# Patient Record
Sex: Female | Born: 1985 | Race: White | Marital: Single | Smoking: Never smoker
Health system: Northeastern US, Academic
[De-identification: ages and names within clinical notes are randomized; demographics above are authoritative.]

---

## 2006-12-10 DIAGNOSIS — I491 Atrial premature depolarization: Secondary | ICD-10-CM | POA: Insufficient documentation

## 2006-12-10 DIAGNOSIS — F3289 Other specified depressive episodes: Secondary | ICD-10-CM | POA: Insufficient documentation

## 2010-01-31 ENCOUNTER — Ambulatory Visit
Admit: 2010-01-31 | Discharge: 2010-01-31 | Disposition: A | Discharge status: Home / Self Care | Payer: Self-pay | Source: Ambulatory Visit | Attending: Obstetrics | Admitting: Obstetrics

## 2010-01-31 ENCOUNTER — Ambulatory Visit
Admit: 2010-01-31 | Discharge: 2010-01-31 | Disposition: A | Discharge status: Home / Self Care | Payer: Self-pay | Source: Ambulatory Visit | Attending: Primary Care | Admitting: Primary Care

## 2010-01-31 LAB — URINALYSIS REFLEX TO CULTURE
Blood,UA: NEGATIVE
Ketones, UA: NEGATIVE
Leuk Esterase,UA: NEGATIVE
Nitrite,UA: NEGATIVE
Protein,UA: NEGATIVE mg/dL

## 2010-01-31 LAB — CBC AND DIFFERENTIAL
Baso # K/uL: 0 THOU/uL (ref 0.0–0.1)
Basophil %: 0.4 % (ref 0.1–1.2)
Eos # K/uL: 0.1 THOU/uL (ref 0.0–0.4)
Eosinophil %: 2.8 % (ref 0.7–5.8)
Hematocrit: 35 % (ref 34–45)
Hemoglobin: 11.6 g/dL (ref 11.2–15.7)
Lymph # K/uL: 1.6 THOU/uL (ref 1.2–3.7)
Lymphocyte %: 33.7 % (ref 19.3–51.7)
MCV: 88 fL (ref 79–95)
Mono # K/uL: 0.4 THOU/uL (ref 0.2–0.9)
Monocyte %: 9.1 % (ref 4.7–12.5)
Neut # K/uL: 2.5 THOU/uL (ref 1.6–6.1)
Platelets: 221 THOU/uL (ref 160–370)
RBC: 4 MIL/uL (ref 3.9–5.2)
RDW: 14.3 % (ref 11.7–14.4)
Seg Neut %: 54 % (ref 34.0–71.1)
WBC: 4.6 THOU/uL (ref 4.0–10.0)

## 2010-01-31 LAB — COMPREHENSIVE METABOLIC PANEL
ALT: 29 U/L (ref 0–35)
AST: 33 U/L (ref 0–35)
Albumin: 4.3 g/dL (ref 3.5–5.2)
Alk Phos: 59 U/L (ref 35–105)
Anion Gap: 10 (ref 7–16)
Bilirubin,Total: 0.4 mg/dL (ref 0.0–1.2)
CO2: 25 mmol/L (ref 20–28)
Calcium: 9.3 mg/dL (ref 9.0–10.4)
Chloride: 104 mmol/L (ref 96–108)
Creatinine: 0.73 mg/dL (ref 0.51–0.95)
GFR,Black: >59 *
GFR,Caucasian: >59 *
Glucose: 80 mg/dL (ref 74–106)
Lab: 13 mg/dL (ref 6–20)
Potassium: 3.8 mmol/L (ref 3.3–5.1)
Sodium: 139 mmol/L (ref 133–145)
Total Protein: 6.9 g/dL (ref 6.3–7.7)

## 2010-01-31 LAB — URINALYSIS WITH REFLEX TO CULTURE
Specific Gravity,UA: 1.013 (ref 1.002–1.030)
pH,UA: 5.5 (ref 5.0–8.0)

## 2010-01-31 LAB — AMYLASE: Amylase: 70 U/L (ref 28–100)

## 2010-01-31 LAB — MAGNESIUM: Magnesium: 1.7 meq/L (ref 1.3–2.1)

## 2010-01-31 LAB — TYPE AND SCREEN
ABO RH Blood Type: O POS
Antibody Screen: NEGATIVE

## 2010-01-31 LAB — HEMOGLOBIN A1C: Hemoglobin A1C: 4.9 % (ref 4.0–6.0)

## 2010-01-31 LAB — LIPASE: Lipase: 40 U/L (ref 13–60)

## 2010-01-31 LAB — PREGNANCY TEST, SERUM: Preg,Serum: POSITIVE — AB

## 2010-01-31 LAB — TSH: TSH: 8.95 u[IU]/mL — ABNORMAL HIGH (ref 0.27–4.20)

## 2010-02-01 LAB — T4, FREE: Free T4: 1.1 ng/dL (ref 0.9–1.7)

## 2010-02-04 LAB — VITAMIN D
25-OH VIT D2: <4 ng/mL
25-OH VIT D3: 19 ng/mL
25-OH Vit Total: 19 ng/mL — ABNORMAL LOW (ref 30–80)

## 2010-02-13 ENCOUNTER — Ambulatory Visit
Admit: 2010-02-13 | Discharge: 2010-02-13 | Disposition: A | Discharge status: Home / Self Care | Payer: Self-pay | Source: Ambulatory Visit | Attending: Obstetrics | Admitting: Obstetrics

## 2010-02-13 ENCOUNTER — Encounter: Payer: Self-pay | Admitting: Gastroenterology

## 2010-02-14 LAB — CHLAMYDIA PLASMID DNA AMPLIFICATION

## 2010-02-15 LAB — SURGICAL PATHOLOGY

## 2010-02-17 LAB — GC CULTURE

## 2010-02-28 ENCOUNTER — Ambulatory Visit: Payer: Self-pay | Admitting: Primary Care

## 2010-02-28 NOTE — Progress Notes (Signed)
Reason For Visit   Follow up: Lab results.  SUBJECTIVE: Gail Griffin is here today for followup of thyroid condition.    Since January 2000 and her free T4 has been between 1.1 and 1.0.  Her TSH   has been elevated three out of four determinations.  Her latest free T4 was   1.1, with a TSH of 8.96.  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  She occasionally becomes out of breath.  She runs   for exercise to stay trim and apparently there is dyspnea out of proportion   to her running.  Her vitamin D 3 is down to 19.     OBJECTIVE: BP is 90/60 on the right.  Chest is clear to PandA.  Heart is of   normal size, however, the sound.  Abdomen is scaphoid, without any organs,   masses, or tenderness.     ASSESSMENT: Compensated hypothyroidism.     PLAN: She is going to start Synthroid 100 mcg daily, and repeat her   chemistries along with the peroxidase, thyroid antibodies, thyroglobulin,   and antithyroglobulin.  She will return here in 7 weeks just after   completing her chemistries.  .  Current Meds   Retin-A CREA;apply twice daily; RPT  Minocycline HCl 100 MG Tablet;TAKE 1 TABLET TWICE DAILY.; Rx  Ocella TABS;TAKE 1 TABLET DAILY.; RPT  Fluticasone Propionate 50 MCG/ACT Suspension;USE 1 SPRAY IN EACH NOSTRIL   TWICE DAILY.; Rx  Citalopram Hydrobromide 20 MG Tablet;take one tablet by mouth every day; Rx.  Vital Signs   Recorded by Aava Deland,Andrea on 28 Feb 2010 09:20 AM  BP:90/50,  RUE,  Sitting,   Weight: 123 lb.  Signature   Electronically signed by: Joana Reamer  M.D.; 02/28/2010 9:52 AM EST.

## 2010-04-19 ENCOUNTER — Ambulatory Visit: Payer: Self-pay | Admitting: Primary Care

## 2010-06-21 ENCOUNTER — Ambulatory Visit: Payer: Self-pay | Admitting: Primary Care

## 2010-12-17 NOTE — Miscellaneous (Unsigned)
 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 ; RPT   Levothyroxine Sodium 100 MCG Tablet; TAKE 1 TABLET DAILY. ; Rx

## 2010-12-27 ENCOUNTER — Encounter: Payer: Self-pay | Admitting: Primary Care

## 2010-12-27 ENCOUNTER — Ambulatory Visit: Payer: Self-pay | Admitting: Primary Care

## 2010-12-27 NOTE — Progress Notes (Signed)
Reason For Visit   Pt here for medication renewal.  SUBJECTIVE: Gail Griffin returns after a long hiatus.     HYPOTHYROIDISM: She still is on Synthroid 0.1 mg daily without any   problems.  She denies change in hair, attention span, sleep pattern or   bowel habits.  No recent free T4 TSH is available.     ACHILLES TENDINITIS: Over the last few months she wakes with pain in the   insertion of the Achilles which is quickly remedied by putting on shoes   that are heeled at least 2 inches.  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     DEPRESSION: Currently she is on citalopram 20 mg daily.  she's tried to stop its a couple of times and found she was unable to.  Her   current boyfriend believes she doesn't need it.     OBJECTIVE: BP is 110/70 on the right seated.  The chest is clear to PandA.    The heart is normal size, rhythm and sounds.  The abdomen is scaphoid   without any orbits, masses or tenderness.  The thyroid is not palpable.    There is no tremor to the outstretched hand and her reflexes are present   and equal bilaterally     ASSESSMENT: She is going to see today on the current thyroid and citalopram   dose that she is doing quite well.     PLAN: She is going to call her gynecologist who has her on Junel oral   contraception but has not continued to fill the prescription.  The patient   will deal with that via the phone.  She'll return here in 6 months.  She is   given a slip to get all the blood work done and a standing order for free   T4 and TSH.  Current Meds   Citalopram Hydrobromide 20 MG Tablet;TAKE ONE TABLET BY MOUTH EVERY   DAY.Appt 12/27/10; Rx  Junel 1.5/30 1.5-30 MG-MCG Tablet;TAKE 1 TABLET DAILY AS DIRECTED.; RPT  Synthroid 100 MCG Tablet;TAKE 1 TABLET DAILY.; RPT.  Vital Signs   Recorded by Stevie Kern on 27 Dec 2010 09:18 AM  BP:106/60,   Weight: 128 lb.  Signature   Electronically signed by: Joana Reamer  M.D.; 12/27/2010 10:21 AM EST.

## 2011-01-07 ENCOUNTER — Ambulatory Visit
Admit: 2011-01-07 | Discharge: 2011-01-07 | Disposition: A | Discharge status: Home / Self Care | Payer: Self-pay | Source: Ambulatory Visit | Attending: Primary Care | Admitting: Primary Care

## 2011-01-07 LAB — CBC AND DIFFERENTIAL
Baso # K/uL: 0 10*3/uL (ref 0.0–0.1)
Basophil %: 0.5 % (ref 0.1–1.2)
Eos # K/uL: 0.2 10*3/uL (ref 0.0–0.4)
Eosinophil %: 3.8 % (ref 0.7–5.8)
Hematocrit: 40 % (ref 34–45)
Hemoglobin: 13 g/dL (ref 11.2–15.7)
Lymph # K/uL: 2 10*3/uL (ref 1.2–3.7)
Lymphocyte %: 47.5 % (ref 19.3–51.7)
MCV: 92 fL (ref 79–95)
Mono # K/uL: 0.4 10*3/uL (ref 0.2–0.9)
Monocyte %: 9.3 % (ref 4.7–12.5)
Neut # K/uL: 1.6 10*3/uL (ref 1.6–6.1)
Platelets: 218 10*3/uL (ref 160–370)
RBC: 4.3 MIL/uL (ref 3.9–5.2)
RDW: 13 % (ref 11.7–14.4)
Seg Neut %: 38.9 % (ref 34.0–71.1)
WBC: 4.2 10*3/uL (ref 4.0–10.0)

## 2011-01-07 LAB — COMPREHENSIVE METABOLIC PANEL
ALT: 20 U/L (ref 0–35)
AST: 26 U/L (ref 0–35)
Albumin: 4.2 g/dL (ref 3.5–5.2)
Alk Phos: 48 U/L (ref 35–105)
Anion Gap: 11 (ref 7–16)
Bilirubin,Total: 0.4 mg/dL (ref 0.0–1.2)
CO2: 24 mmol/L (ref 20–28)
Calcium: 9.2 mg/dL (ref 8.8–10.2)
Chloride: 107 mmol/L (ref 96–108)
Creatinine: 0.91 mg/dL (ref 0.51–0.95)
GFR,Black: >59 *
GFR,Caucasian: >59 *
Glucose: 86 mg/dL (ref 74–106)
Lab: 14 mg/dL (ref 6–20)
Potassium: 4 mmol/L (ref 3.3–5.1)
Sodium: 142 mmol/L (ref 133–145)
Total Protein: 6.9 g/dL (ref 6.3–7.7)

## 2011-01-07 LAB — TSH: TSH: 2.07 u[IU]/mL (ref 0.27–4.20)

## 2011-01-07 LAB — LIPID PANEL
Chol/HDL Ratio: 2.7
Cholesterol: 156 mg/dL
HDL: 57 mg/dL
LDL Calculated: 87 mg/dL
Non HDL Cholesterol: 99 mg/dL
Triglycerides: 60 mg/dL

## 2011-01-07 LAB — URINE MICROSCOPIC (IQ200)
RBC,UA: NONE SEEN /hpf (ref 0–2)
WBC,UA: 2 /hpf (ref 0–5)

## 2011-01-07 LAB — URINALYSIS REFLEX TO CULTURE
Blood,UA: NEGATIVE
Ketones, UA: NEGATIVE
Leuk Esterase,UA: NEGATIVE
Nitrite,UA: NEGATIVE
Protein,UA: 10 mg/dL — AB
Specific Gravity,UA: 1.026 (ref 1.002–1.030)
pH,UA: 5.5 (ref 5.0–8.0)

## 2011-01-07 LAB — T4, FREE: Free T4: 1.6 ng/dL (ref 0.9–1.7)

## 2011-01-07 LAB — MAGNESIUM: Magnesium: 1.7 mEq/L (ref 1.3–2.1)

## 2011-01-08 LAB — HEMOGLOBIN A1C: Hemoglobin A1C: 4.7 % (ref 4.0–6.0)

## 2011-01-10 LAB — VITAMIN D
25-OH VIT D2: <4 ng/mL
25-OH VIT D3: 42 ng/mL
25-OH Vit Total: 42 ng/mL (ref 30–80)

## 2011-05-25 ENCOUNTER — Telehealth: Payer: Self-pay | Admitting: Primary Care

## 2011-05-25 NOTE — Telephone Encounter (Signed)
The patient is calling with rectal bleeding.  She went running this morning and thereafter had some pain and a few spots of blood on the toilet paper after a BM.  This has happened to her twice before in the last 12 years, always after running, always mild, always resolves by the next day.  She has hemorrhoids.  Had a little diarrhea and abdominal cramping this weekend, which happens to her on and off.  Wondering if she should be worried.  I explained that this is common after running, but if it becomes a recurrent problem, she should see Dr. Verdie Drown.

## 2011-06-09 ENCOUNTER — Other Ambulatory Visit: Payer: Self-pay | Admitting: Primary Care

## 2011-06-09 NOTE — Telephone Encounter (Signed)
Gail Griffin has an appt in the next . She needs a refill on thyroid medication. Directions are 1 daily and she would like a 90 day supply. Please send to [facility] in [place]. C/b # if needed is [phone]

## 2011-06-10 ENCOUNTER — Ambulatory Visit
Admit: 2011-06-10 | Discharge: 2011-06-10 | Disposition: A | Discharge status: Home / Self Care | Payer: Self-pay | Source: Ambulatory Visit | Attending: Obstetrics and Gynecology | Admitting: Obstetrics and Gynecology

## 2011-06-10 MED ORDER — LEVOTHYROXINE SODIUM 100 MCG PO TABS *I*
ORAL_TABLET | ORAL | Status: DC
Start: 2011-06-09 — End: 2011-12-04

## 2011-06-13 ENCOUNTER — Other Ambulatory Visit: Payer: Self-pay | Admitting: Primary Care

## 2011-06-13 DIAGNOSIS — E039 Hypothyroidism, unspecified: Secondary | ICD-10-CM

## 2011-06-14 ENCOUNTER — Other Ambulatory Visit: Payer: Self-pay | Admitting: Primary Care

## 2011-06-16 LAB — HPV DNA PROBE WITH CYTOLOGY: HPV Hybrid Capture: NEGATIVE

## 2011-06-17 LAB — GYN CYTOLOGY

## 2011-06-30 ENCOUNTER — Other Ambulatory Visit: Payer: Self-pay | Admitting: Primary Care

## 2011-06-30 NOTE — Telephone Encounter (Signed)
Labs and appointments up to date.  Ok for refill.

## 2011-07-01 ENCOUNTER — Ambulatory Visit: Payer: Self-pay | Admitting: Primary Care

## 2011-08-04 ENCOUNTER — Ambulatory Visit
Admit: 2011-08-04 | Discharge: 2011-08-04 | Disposition: A | Discharge status: Home / Self Care | Payer: Self-pay | Source: Ambulatory Visit | Attending: Primary Care | Admitting: Primary Care

## 2011-08-04 DIAGNOSIS — E039 Hypothyroidism, unspecified: Secondary | ICD-10-CM

## 2011-08-04 LAB — T4, FREE: Free T4: 1.3 ng/dL (ref 0.9–1.7)

## 2011-08-04 LAB — TSH: TSH: 1.11 u[IU]/mL (ref 0.27–4.20)

## 2011-08-06 ENCOUNTER — Ambulatory Visit: Payer: Self-pay | Admitting: Primary Care

## 2011-08-06 ENCOUNTER — Encounter: Payer: Self-pay | Admitting: Primary Care

## 2011-08-06 VITALS — BP 110/80 | HR 42 | Wt 123.0 lb

## 2011-08-06 DIAGNOSIS — E039 Hypothyroidism, unspecified: Secondary | ICD-10-CM

## 2011-08-06 DIAGNOSIS — E559 Vitamin D deficiency, unspecified: Secondary | ICD-10-CM

## 2011-08-06 DIAGNOSIS — K130 Diseases of lips: Secondary | ICD-10-CM

## 2011-08-06 DIAGNOSIS — L309 Dermatitis, unspecified: Secondary | ICD-10-CM

## 2011-08-06 DIAGNOSIS — M7662 Achilles tendinitis, left leg: Secondary | ICD-10-CM

## 2011-08-06 DIAGNOSIS — J029 Acute pharyngitis, unspecified: Secondary | ICD-10-CM

## 2011-08-06 MED ORDER — AMMONIUM LACTATE 12 % EX LOTN *I*
TOPICAL_LOTION | Freq: Two times a day (BID) | CUTANEOUS | Status: AC | PRN
Start: 2011-08-06 — End: ?

## 2011-08-06 NOTE — Progress Notes (Signed)
SUBJECTIVE: PHARYNGITIS: She reports having mild gradual discomfort for the last 2 weeks without fever, chills and lymphadenopathy no cough, sputum production or rhinitis .    Left posterior and lateral heel pain after running for exercise.  The pain never occurs when she wears heels or during exercise.  It is after she removes her shoes she has pain at the insertion of the Achilles laterally.    She has a small "bump" on the left side of her lip for the past year.  It varies in size but never disappears.  There is never any drainage nor does it interfere with speech.    The right triceps area there is a pruritic rash she has been treating with moisturizes of her incontinence.  The skin is rough and pruritic and compatible with eczema.    HYPOTHYROIDISM: She continues on 100 mcg levothyroxin without any problems.  She denies hair loss, change in attention span, bowel habits or sleep pattern.  Her free T4 and TSH were normal.    OBJECTIVE: BP is 110/80 in the right seated.    GENERAL:  Alert, no distress  HEENT:  NCAT, PERRL, EOMI.  No ocular discharge.  TM's pearly with normal anatomy bilaterally.  Nares patent.  Oropharynx clear with moist mucous membranes.  There is a small mucocele in the left lower lip.  NECK:  Supple without significant adenopathy.  Thyroid is small and smooth.  CARDIAC:  RRR, S1 S2, no murmur  LUNGS:  Clear to auscultation bilaterally  ABDOMEN:  Soft, nontender, nondistended with positive bowel sounds, no masses or organomegaly  SKIN:  no rashes    ASSESSMENT: The rash is probably secondary to eczema.  Her throat culture is negative and there is barely any exudate.  The lump on her lip is a mucocele.    PLAN: She will see Dr. Gregery Na regarding mucocele of the lip.  She is going to get any feelings flexion boot to wear at night to avoid the morning pain.  She'll continue her current dose of thyroid.  AmLactin lotion to her upper arm.  She will return here if her symptoms persist.

## 2011-08-08 LAB — STREP A CULTURE, THROAT

## 2011-08-18 ENCOUNTER — Telehealth: Payer: Self-pay | Admitting: Primary Care

## 2011-08-18 NOTE — Telephone Encounter (Signed)
Gail Griffin said her mother told her there was a voice message saying to call er doctor for a consults. Didn't know what this was about. I don't see any one from here calling her. We are to cell her cell [phone] from now on not her home number. Did you call her

## 2011-08-19 NOTE — Telephone Encounter (Signed)
MAY HAVE BEEN DR LANGSTEIN'S OFFICE

## 2011-09-25 ENCOUNTER — Other Ambulatory Visit: Payer: Self-pay | Admitting: Primary Care

## 2011-12-04 ENCOUNTER — Other Ambulatory Visit: Payer: Self-pay | Admitting: Primary Care

## 2011-12-29 ENCOUNTER — Other Ambulatory Visit: Payer: Self-pay | Admitting: Primary Care

## 2011-12-29 ENCOUNTER — Telehealth: Payer: Self-pay | Admitting: Primary Care

## 2011-12-29 MED ORDER — CITALOPRAM HYDROBROMIDE 20 MG PO TABS *I*
ORAL_TABLET | ORAL | Status: DC
Start: 2011-12-29 — End: 2012-03-12

## 2012-01-26 ENCOUNTER — Ambulatory Visit: Payer: Self-pay | Admitting: Primary Care

## 2012-02-17 ENCOUNTER — Ambulatory Visit: Payer: Self-pay | Admitting: Primary Care

## 2012-02-20 ENCOUNTER — Ambulatory Visit: Payer: Self-pay | Admitting: Primary Care

## 2012-02-20 VITALS — BP 90/60 | HR 40 | Ht 65.0 in | Wt 129.0 lb

## 2012-02-20 DIAGNOSIS — E039 Hypothyroidism, unspecified: Secondary | ICD-10-CM

## 2012-02-20 MED ORDER — TACROLIMUS 0.03 % EX OINT *I*
TOPICAL_OINTMENT | Freq: Two times a day (BID) | CUTANEOUS | Status: AC
Start: 2012-02-20 — End: ?

## 2012-02-20 NOTE — Progress Notes (Signed)
SUBJECTIVE: HYPOTHYROIDISM: Patient returns days planning to move to [place] and get married they are in the fall.  She is feeling very well and was on a stable dose with her.  She denies any change in weight, hair loss, attention span, bowel habits, mostly when.    DEPRESSION: On 20 mg citalopram she feels very well.  She is jovial and engaging and has no problem with diet, exercise or sleep.  She said no reaction to the drug.    OBJECTIVE: BP is 90/60 in the right seated.  Pulse rate is 40.    GENERAL:  Alert, no distress  NECK:  Supple without significant adenopathy.  The thyroid is very small and in normal position.  CARDIAC:  RRR, S1 S2, no murmur  LUNGS:  Clear to auscultation bilaterally  ABDOMEN:  Soft, nontender, nondistended with positive bowel sounds, no masses or organomegaly  SKIN:  no rashes  NEURO: Cranial nerves are intact.  Finger to nose normal Romberg and Babinski signs are normal.  Reflexes are present and equal bilaterally    ASSESSMENT: Well controlled hypothyroidism and depression    PLAN: Continue current medications    Repeat laboratory tests next week    Return here if any other abnormalities before transition to [place]

## 2012-02-26 ENCOUNTER — Ambulatory Visit
Admit: 2012-02-26 | Discharge: 2012-02-26 | Disposition: A | Discharge status: Home / Self Care | Payer: Self-pay | Source: Ambulatory Visit | Attending: Primary Care | Admitting: Primary Care

## 2012-02-26 DIAGNOSIS — E039 Hypothyroidism, unspecified: Secondary | ICD-10-CM

## 2012-02-26 LAB — COMPREHENSIVE METABOLIC PANEL
ALT: 25 U/L (ref 0–35)
AST: 31 U/L (ref 0–35)
Albumin: 4.3 g/dL (ref 3.5–5.2)
Alk Phos: 55 U/L (ref 35–105)
Anion Gap: 9 (ref 7–16)
Bilirubin,Total: 0.3 mg/dL (ref 0.0–1.2)
CO2: 26 mmol/L (ref 20–28)
Calcium: 8.6 mg/dL — ABNORMAL LOW (ref 8.8–10.2)
Chloride: 104 mmol/L (ref 96–108)
Creatinine: 0.77 mg/dL (ref 0.51–0.95)
GFR,Black: 123 *
GFR,Caucasian: 106 *
Glucose: 95 mg/dL (ref 60–99)
Lab: 16 mg/dL (ref 6–20)
Potassium: 4.2 mmol/L (ref 3.3–5.1)
Sodium: 139 mmol/L (ref 133–145)
Total Protein: 7 g/dL (ref 6.3–7.7)

## 2012-02-26 LAB — CBC AND DIFFERENTIAL
Baso # K/uL: 0 10*3/uL (ref 0.0–0.1)
Basophil %: 0.4 % (ref 0.1–1.2)
Eos # K/uL: 0.2 10*3/uL (ref 0.0–0.4)
Eosinophil %: 4.5 % (ref 0.7–5.8)
Hematocrit: 39 % (ref 34–45)
Hemoglobin: 13.3 g/dL (ref 11.2–15.7)
Lymph # K/uL: 1.9 10*3/uL (ref 1.2–3.7)
Lymphocyte %: 40 % (ref 19.3–51.7)
MCV: 93 fL (ref 79–95)
Mono # K/uL: 0.4 10*3/uL (ref 0.2–0.9)
Monocyte %: 8.5 % (ref 4.7–12.5)
Neut # K/uL: 2.2 10*3/uL (ref 1.6–6.1)
Platelets: 234 10*3/uL (ref 160–370)
RBC: 4.2 MIL/uL (ref 3.9–5.2)
RDW: 12.9 % (ref 11.7–14.4)
Seg Neut %: 46.6 % (ref 34.0–71.1)
WBC: 4.7 10*3/uL (ref 4.0–10.0)

## 2012-02-26 LAB — TSH: TSH: 2.05 u[IU]/mL (ref 0.27–4.20)

## 2012-02-26 LAB — T4, FREE: Free T4: 1.5 ng/dL (ref 0.9–1.7)

## 2012-02-27 LAB — MICROALBUMIN, URINE, RANDOM
Creatinine,UR: 120 mg/dL (ref 20–300)
Microalb/Creat Ratio: <2.5 mg MA/g CR (ref 0.0–29.9)
Microalbumin,UR: <0.3 mg/dL

## 2012-03-12 ENCOUNTER — Other Ambulatory Visit: Payer: Self-pay | Admitting: Primary Care

## 2012-03-12 NOTE — Telephone Encounter (Signed)
Labs and appointments up to date.  Ok for refill.

## 2012-03-16 ENCOUNTER — Other Ambulatory Visit: Payer: Self-pay | Admitting: Primary Care

## 2012-03-16 ENCOUNTER — Telehealth: Payer: Self-pay | Admitting: Primary Care

## 2012-03-16 MED ORDER — LEVOTHYROXINE SODIUM 100 MCG PO TABS *I*
ORAL_TABLET | ORAL | Status: DC
Start: 2012-03-16 — End: 2012-06-28

## 2012-03-16 NOTE — Telephone Encounter (Signed)
Please refill levothyroxine , qty 90.  Uses [facility].  She is completely out of this med.  Says she requested this a few days ago, but citalopram was refilled instead.  CB# below if needed

## 2012-06-16 ENCOUNTER — Other Ambulatory Visit: Payer: Self-pay | Admitting: Primary Care

## 2012-06-17 MED ORDER — CITALOPRAM HYDROBROMIDE 20 MG PO TABS *I*
ORAL_TABLET | ORAL | Status: DC
Start: 2012-06-16 — End: 2012-06-28

## 2012-06-28 ENCOUNTER — Other Ambulatory Visit: Payer: Self-pay | Admitting: Primary Care

## 2012-06-28 ENCOUNTER — Telehealth: Payer: Self-pay | Admitting: Primary Care

## 2012-06-28 ENCOUNTER — Encounter: Payer: Self-pay | Admitting: Primary Care

## 2012-06-28 NOTE — Telephone Encounter (Signed)
Gail Griffin left a message on my vm. She has moved to [place] , [place]. Her last rx for her thyroid medication was lost in the mail. She would like rx's for both of her medications (? Names) sent to [facility] in [place]. [facility] number [phone]. Her c/b # is [phone].

## 2012-06-28 NOTE — Telephone Encounter (Signed)
Sent to [facility] for [employer].

## 2012-06-28 NOTE — Telephone Encounter (Signed)
Error

## 2012-06-29 MED ORDER — CITALOPRAM HYDROBROMIDE 20 MG PO TABS *I*
ORAL_TABLET | ORAL | Status: AC
Start: 2012-06-28 — End: ?

## 2012-06-29 MED ORDER — LEVOTHYROXINE SODIUM 100 MCG PO TABS *I*
ORAL_TABLET | ORAL | Status: AC
Start: 2012-06-28 — End: ?

## 2012-09-11 ENCOUNTER — Other Ambulatory Visit: Payer: Self-pay | Admitting: Primary Care

## 2012-12-10 ENCOUNTER — Other Ambulatory Visit: Payer: Self-pay | Admitting: Internal Medicine

## 2016-08-05 ENCOUNTER — Encounter: Payer: Self-pay | Admitting: Primary Care

## 2016-08-07 ENCOUNTER — Telehealth: Payer: Self-pay | Admitting: Primary Care

## 2016-08-07 NOTE — Telephone Encounter (Signed)
Out reach #1

## 2018-05-13 DIAGNOSIS — Z01411 Encounter for gynecological examination (general) (routine) with abnormal findings: Secondary | ICD-10-CM | POA: Diagnosis not present

## 2018-08-13 DIAGNOSIS — G4700 Insomnia, unspecified: Secondary | ICD-10-CM | POA: Diagnosis not present

## 2018-08-13 DIAGNOSIS — E039 Hypothyroidism, unspecified: Secondary | ICD-10-CM | POA: Diagnosis not present

## 2018-08-13 DIAGNOSIS — F411 Generalized anxiety disorder: Secondary | ICD-10-CM | POA: Diagnosis not present

## 2018-09-17 DIAGNOSIS — J029 Acute pharyngitis, unspecified: Secondary | ICD-10-CM | POA: Diagnosis not present

## 2018-09-17 DIAGNOSIS — F411 Generalized anxiety disorder: Secondary | ICD-10-CM | POA: Diagnosis not present

## 2018-09-17 DIAGNOSIS — G4700 Insomnia, unspecified: Secondary | ICD-10-CM | POA: Diagnosis not present

## 2018-10-23 DIAGNOSIS — F411 Generalized anxiety disorder: Secondary | ICD-10-CM | POA: Diagnosis not present

## 2018-12-08 HISTORY — PX: AUGMENTATION MAMMAPLASTY: SUR837

## 2019-04-29 DIAGNOSIS — F329 Major depressive disorder, single episode, unspecified: Secondary | ICD-10-CM | POA: Diagnosis not present

## 2019-04-29 DIAGNOSIS — E039 Hypothyroidism, unspecified: Secondary | ICD-10-CM | POA: Diagnosis not present

## 2019-07-12 ENCOUNTER — Other Ambulatory Visit: Admission: RE | Admit: 2019-07-12 | Payer: Self-pay | Source: Ambulatory Visit

## 2019-07-20 DIAGNOSIS — Z111 Encounter for screening for respiratory tuberculosis: Secondary | ICD-10-CM | POA: Diagnosis not present

## 2019-07-22 DIAGNOSIS — E039 Hypothyroidism, unspecified: Secondary | ICD-10-CM | POA: Diagnosis not present

## 2019-07-22 DIAGNOSIS — Z0000 Encounter for general adult medical examination without abnormal findings: Secondary | ICD-10-CM | POA: Diagnosis not present

## 2019-07-22 DIAGNOSIS — I10 Essential (primary) hypertension: Secondary | ICD-10-CM | POA: Diagnosis not present

## 2019-07-22 DIAGNOSIS — Z13220 Encounter for screening for lipoid disorders: Secondary | ICD-10-CM | POA: Diagnosis not present

## 2019-08-08 DIAGNOSIS — Z20828 Contact with and (suspected) exposure to other viral communicable diseases: Secondary | ICD-10-CM | POA: Diagnosis not present

## 2019-08-09 DIAGNOSIS — R197 Diarrhea, unspecified: Secondary | ICD-10-CM | POA: Diagnosis not present

## 2019-08-09 DIAGNOSIS — J029 Acute pharyngitis, unspecified: Secondary | ICD-10-CM | POA: Diagnosis not present

## 2019-09-22 DIAGNOSIS — Z01419 Encounter for gynecological examination (general) (routine) without abnormal findings: Secondary | ICD-10-CM | POA: Diagnosis not present

## 2019-10-31 DIAGNOSIS — F329 Major depressive disorder, single episode, unspecified: Secondary | ICD-10-CM | POA: Diagnosis not present

## 2019-10-31 DIAGNOSIS — E039 Hypothyroidism, unspecified: Secondary | ICD-10-CM | POA: Diagnosis not present

## 2019-10-31 DIAGNOSIS — I10 Essential (primary) hypertension: Secondary | ICD-10-CM | POA: Diagnosis not present

## 2020-02-04 ENCOUNTER — Ambulatory Visit: Payer: Self-pay

## 2020-02-04 NOTE — Progress Notes (Signed)
   Covid-19 Vaccination Clinic  Name:  Ellen Compton    MRN: [id number] DOB: 30-Dec-1985  02/04/2020  Ellen Compton was observed post Covid-19 immunization for 15 minutes without incidence. She was provided with Vaccine Information Sheet and instruction to access the V-Safe system.   Ellen Compton was instructed to call 911 with any severe reactions post vaccine: Marland Kitchen Difficulty breathing  . Swelling of your face and throat  . A fast heartbeat  . A bad rash all over your body  . Dizziness and weakness    Immunizations Administered    Name Date Dose VIS Date Route   Pfizer COVID-19 Vaccine 02/04/2020  9:27 AM 0.3 mL 11/18/2019 Intramuscular   Manufacturer: [employer], [employer]   Lot: [id number]   NDC: [address]-7

## 2020-02-29 ENCOUNTER — Ambulatory Visit: Payer: Self-pay

## 2020-03-03 ENCOUNTER — Ambulatory Visit: Payer: Self-pay | Attending: Internal Medicine

## 2020-03-14 ENCOUNTER — Ambulatory Visit: Payer: Self-pay | Attending: Internal Medicine

## 2020-03-14 DIAGNOSIS — Z23 Encounter for immunization: Secondary | ICD-10-CM

## 2020-03-14 NOTE — Progress Notes (Signed)
   Covid-19 Vaccination Clinic  Name:  Ellen Compton    MRN: [id number] DOB: Apr 08, 1986  03/14/2020  [name] was observed post Covid-19 immunization for 15 minutes without incident. She was provided with Vaccine Information Sheet and instruction to access the V-Safe system.   [name] was instructed to call 911 with any severe reactions post vaccine: Marland Kitchen Difficulty breathing  . Swelling of face and throat  . A fast heartbeat  . A bad rash all over body  . Dizziness and weakness   Immunizations Administered    Name Date Dose VIS Date Route   Pfizer COVID-19 Vaccine 03/14/2020  1:25 PM 0.3 mL 11/18/2019 Intramuscular   Manufacturer: [employer], [employer]   Lot: [id number]   NDC: [address]-8

## 2020-03-16 ENCOUNTER — Other Ambulatory Visit: Payer: Self-pay | Admitting: Family Medicine

## 2020-03-16 DIAGNOSIS — N63 Unspecified lump in unspecified breast: Secondary | ICD-10-CM | POA: Diagnosis not present

## 2020-03-16 DIAGNOSIS — N6310 Unspecified lump in the right breast, unspecified quadrant: Secondary | ICD-10-CM

## 2020-03-16 DIAGNOSIS — I10 Essential (primary) hypertension: Secondary | ICD-10-CM | POA: Diagnosis not present

## 2020-03-26 ENCOUNTER — Ambulatory Visit
Admission: RE | Admit: 2020-03-26 | Discharge: 2020-03-26 | Disposition: A | Discharge status: Home / Self Care | Payer: Self-pay | Source: Ambulatory Visit | Attending: Family Medicine | Admitting: Family Medicine

## 2020-03-26 ENCOUNTER — Other Ambulatory Visit: Payer: Self-pay

## 2020-03-26 DIAGNOSIS — N6310 Unspecified lump in the right breast, unspecified quadrant: Secondary | ICD-10-CM

## 2020-03-26 DIAGNOSIS — N6001 Solitary cyst of right breast: Secondary | ICD-10-CM | POA: Diagnosis not present

## 2020-03-26 DIAGNOSIS — R922 Inconclusive mammogram: Secondary | ICD-10-CM | POA: Diagnosis not present

## 2020-10-25 ENCOUNTER — Ambulatory Visit: Payer: Self-pay | Attending: Internal Medicine

## 2020-10-25 DIAGNOSIS — Z23 Encounter for immunization: Secondary | ICD-10-CM

## 2020-10-25 NOTE — Progress Notes (Signed)
   Covid-19 Vaccination Clinic  Name:  Anahita Cua    MRN: [id number] DOB: Jan 25, 1986  10/25/2020  Ms. Bramer was observed post Covid-19 immunization for 15 minutes without incident. She was provided with Vaccine Information Sheet and instruction to access the V-Safe system.   Ms. Wesche was instructed to call 911 with any severe reactions post vaccine: Marland Kitchen Difficulty breathing  . Swelling of face and throat  . A fast heartbeat  . A bad rash all over body  . Dizziness and weakness   Immunizations Administered    Name Date Dose VIS Date Route   Pfizer COVID-19 Vaccine 10/25/2020  5:22 PM 0.3 mL 09/26/2020 Intramuscular   Manufacturer: [employer], [employer]   Lot: [id number]   NDC: [address]-9

## 2020-11-20 ENCOUNTER — Other Ambulatory Visit: Payer: Self-pay | Admitting: Family Medicine

## 2020-11-20 DIAGNOSIS — R109 Unspecified abdominal pain: Secondary | ICD-10-CM

## 2020-12-29 IMAGING — MG MM  DIGITAL DIAGNOSTIC BREAST BILAT IMPLANT W/ TOMO W/ CAD
8 of 14 series · 8 of 34 positions shown · non-contrast
Comparison: Baseline exam

CLINICAL DATA: Palpable abnormality in the 6 o'clock location of
the RIGHT breast for 6 weeks.

EXAM:
DIGITAL DIAGNOSTIC BILATERAL MAMMOGRAM WITH IMPLANTS AND TOMO
ULTRASOUND RIGHT BREAST
The patient has retropectoral implants. Standard and implant
displaced views were performed.

[L MLO]
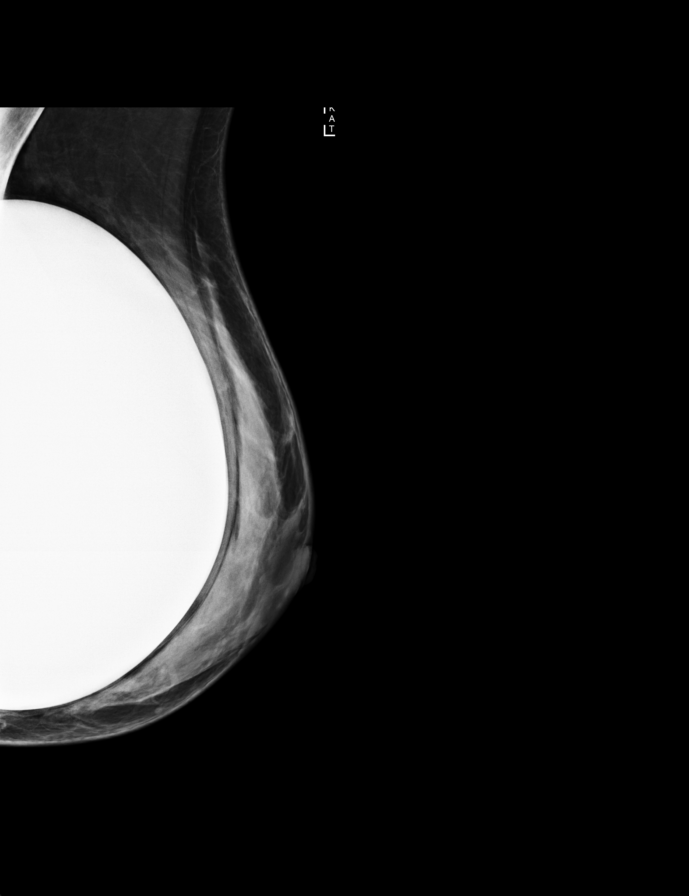

[R CC]
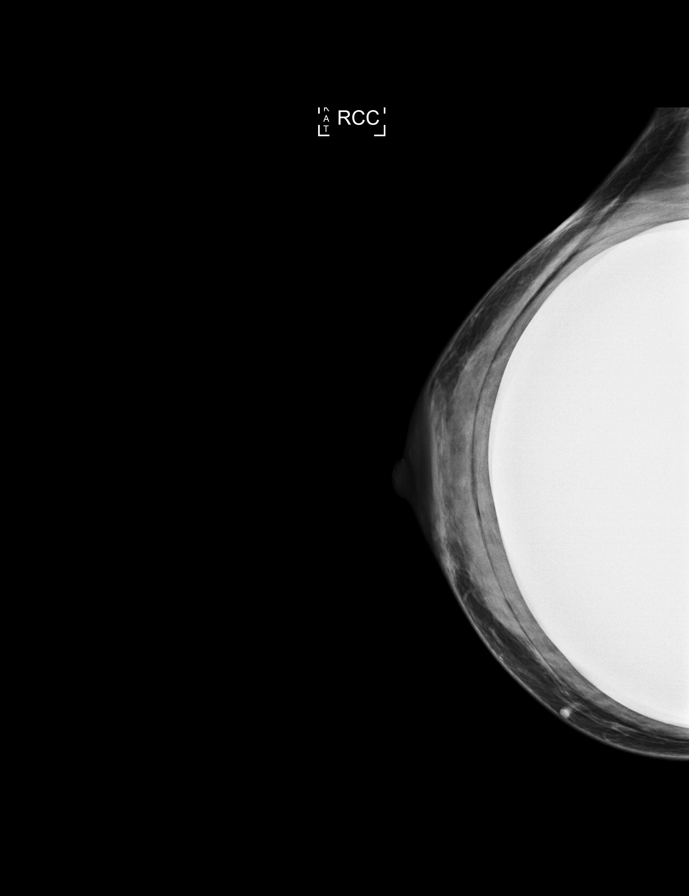

[R MLO]
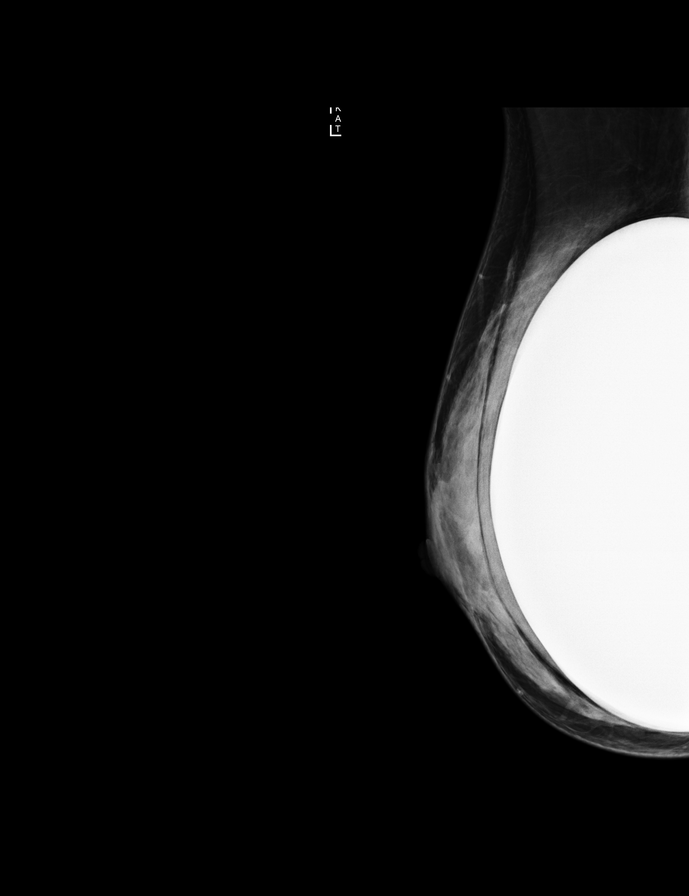

[L CC]
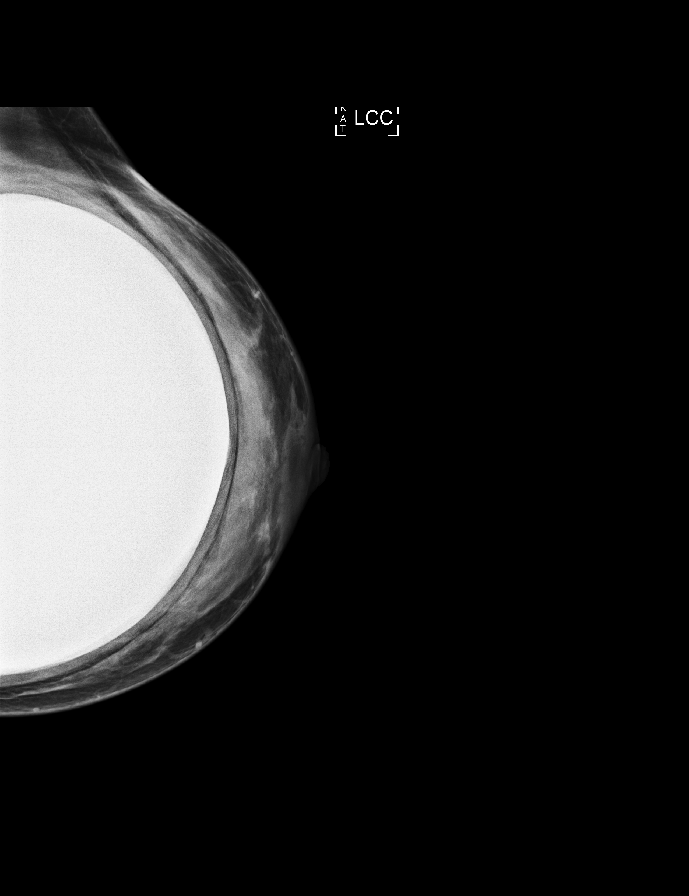

[R TAN synth-2D]
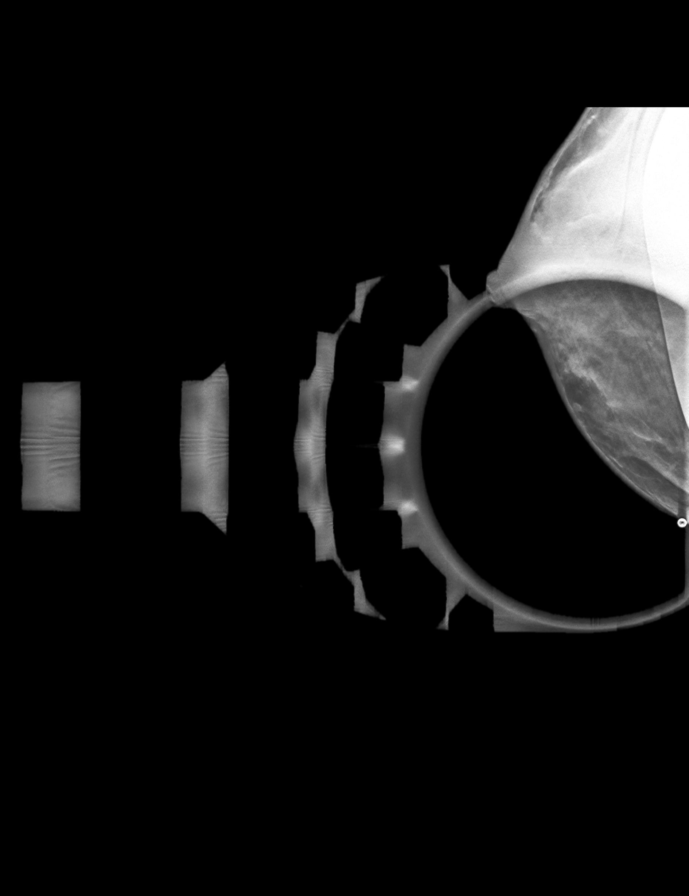

[R CC synth-2D]
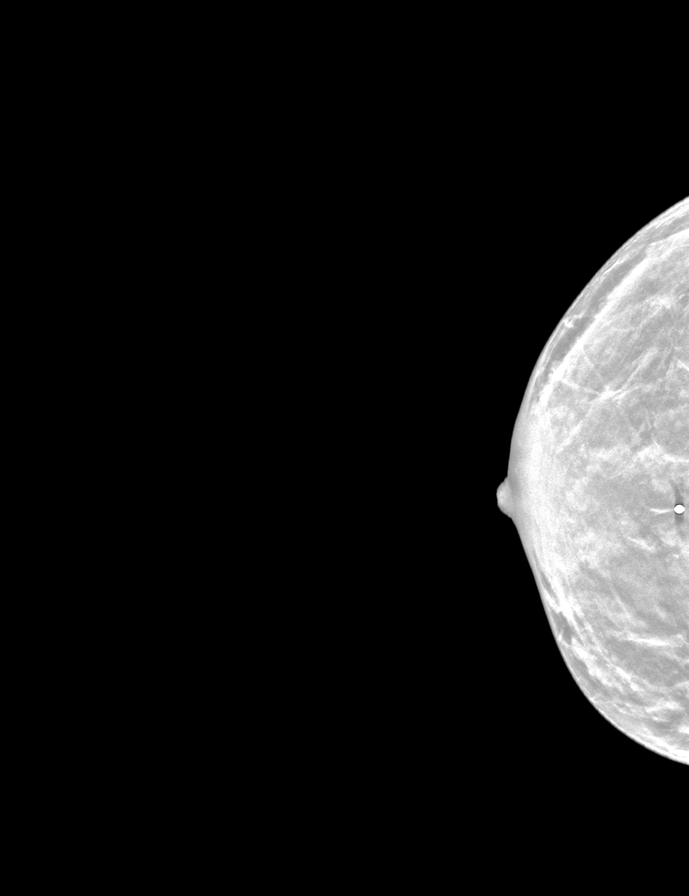

[R MLO synth-2D]
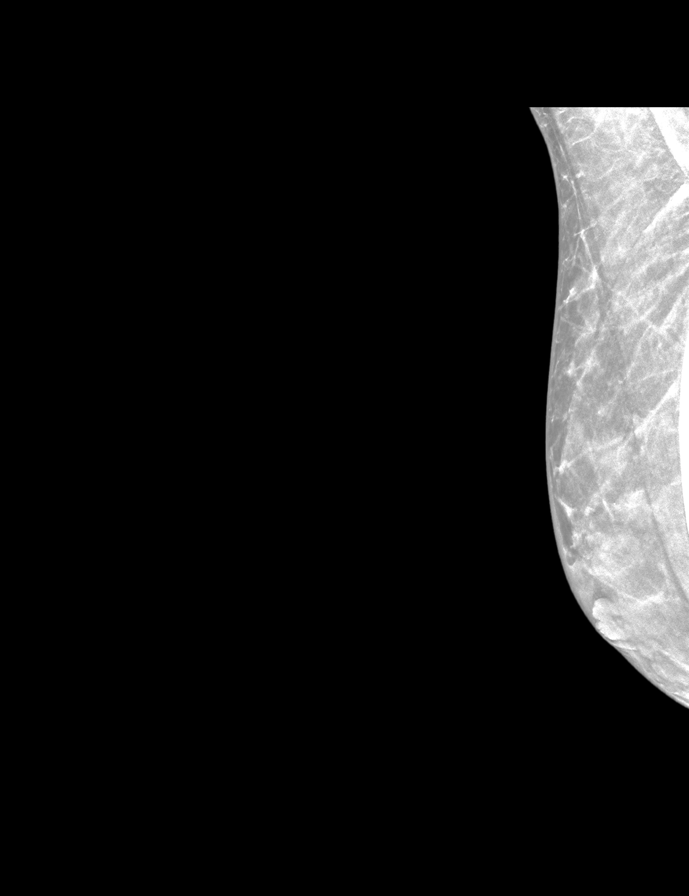

[L MLO synth-2D]
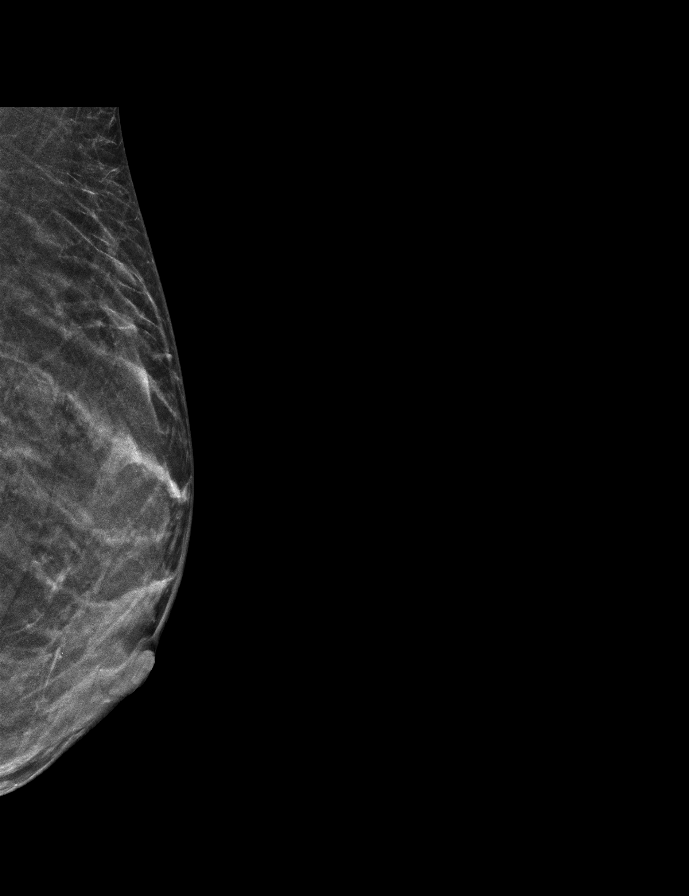

[8 of 34 positions shown; findings below may reference images not displayed]

ACR Breast Density Category d: The breast tissue is extremely dense,
which lowers the sensitivity of mammography.
FINDINGS: No suspicious mass, distortion, or microcalcifications are
identified to suggest presence of malignancy. Spot tangential view
is performed in the area of concern in the LOWER portion of the
RIGHT breast, showing normal appearing dense fibroglandular tissue.

On physical exam, I palpate a superficial mobile mass in the 6
o'clock location of the RIGHT breast in the area of concern to the
patient.

Targeted ultrasound is performed, showing a simple cyst in the 6
o'clock location of the RIGHT breast 7 centimeters from the nipple
measuring 0.5 x 0.2 x 0.7 centimeters. No solid component or
internal blood flow.
IMPRESSION: Palpable abnormality is a benign cyst. No mammographic or ultrasound
evidence for malignancy.

RECOMMENDATION:
Screening mammogram at age 39 unless there are persistent or
intervening clinical concerns. (Code:M9-4-QRF)

I have discussed the findings and recommendations with the patient.
If applicable, a reminder letter will be sent to the patient
regarding the next appointment.

BI-RADS CATEGORY  2: Benign.

## 2020-12-29 IMAGING — US US BREAST*R* LIMITED INC AXILLA
1 series · 6 of 6 positions shown · non-contrast
Comparison: Baseline exam

CLINICAL DATA: Palpable abnormality in the 6 o'clock location of
the RIGHT breast for 6 weeks.

EXAM:
DIGITAL DIAGNOSTIC BILATERAL MAMMOGRAM WITH IMPLANTS AND TOMO
ULTRASOUND RIGHT BREAST
The patient has retropectoral implants. Standard and implant
displaced views were performed.

[Series 1: us breast*right* limited inc axilla · 0.05mm/px · 6 of 6 slices shown]
[im 1/6]
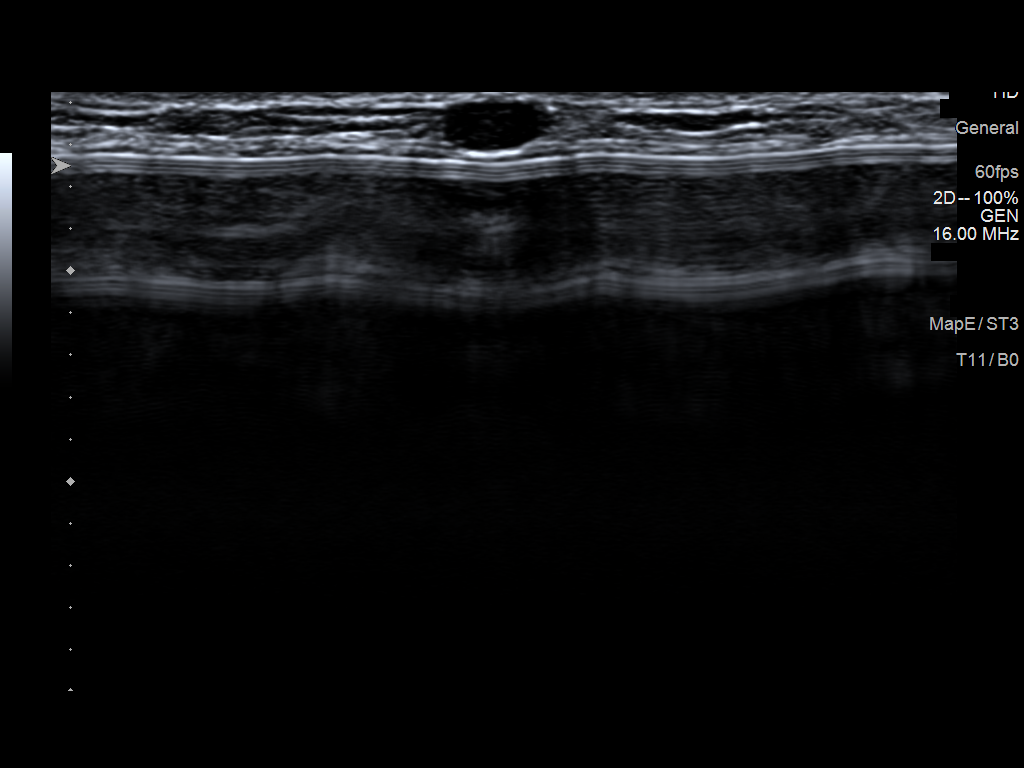
[im 2/6]
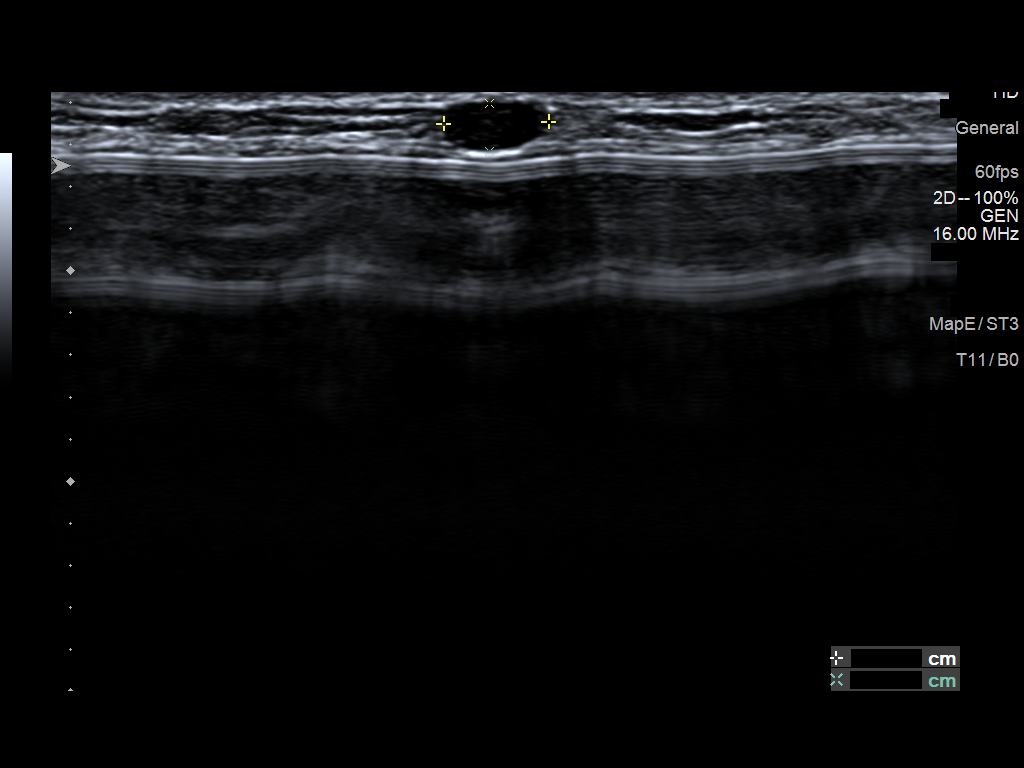
[im 3/6]
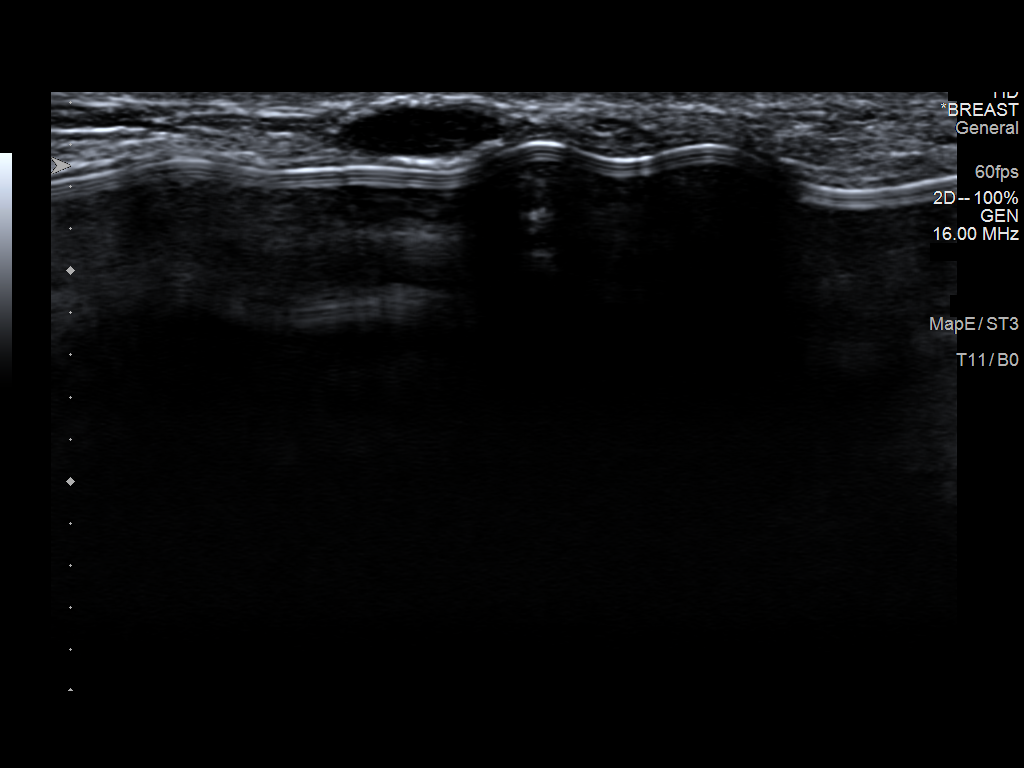
[im 4/6]
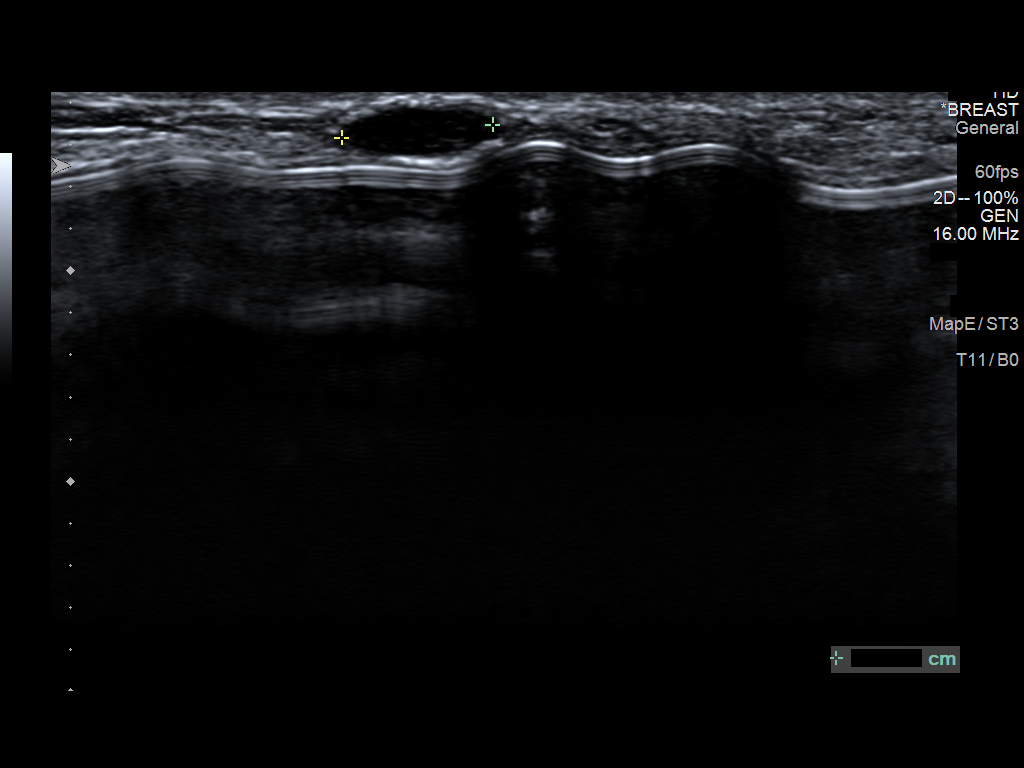
[im 5/6]
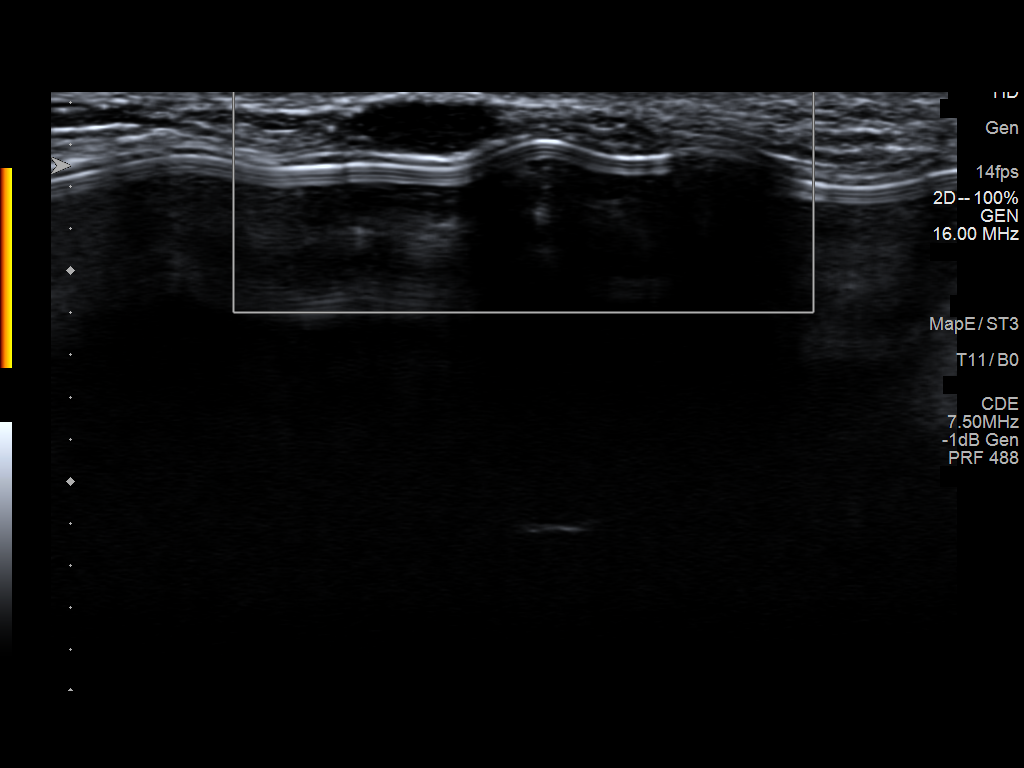
[im 6/6]
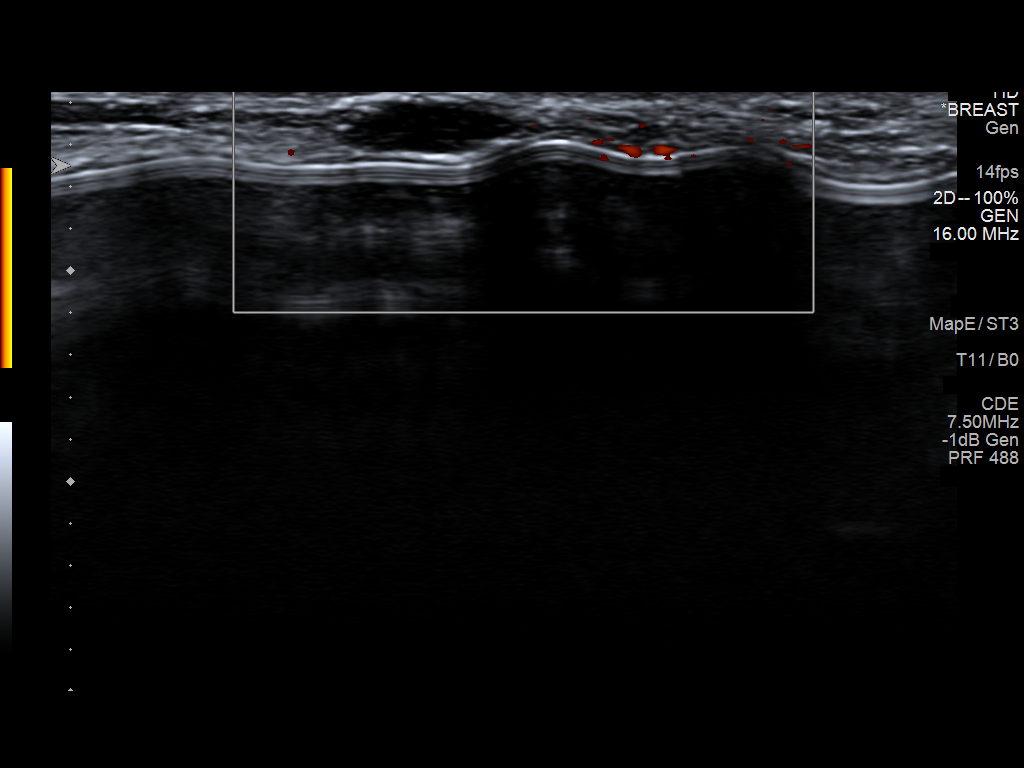

[6 of 6 positions shown; findings below may reference images not displayed]

ACR Breast Density Category d: The breast tissue is extremely dense,
which lowers the sensitivity of mammography.
FINDINGS: No suspicious mass, distortion, or microcalcifications are
identified to suggest presence of malignancy. Spot tangential view
is performed in the area of concern in the LOWER portion of the
RIGHT breast, showing normal appearing dense fibroglandular tissue.

On physical exam, I palpate a superficial mobile mass in the 6
o'clock location of the RIGHT breast in the area of concern to the
patient.

Targeted ultrasound is performed, showing a simple cyst in the 6
o'clock location of the RIGHT breast 7 centimeters from the nipple
measuring 0.5 x 0.2 x 0.7 centimeters. No solid component or
internal blood flow.
IMPRESSION: Palpable abnormality is a benign cyst. No mammographic or ultrasound
evidence for malignancy.

RECOMMENDATION:
Screening mammogram at age 39 unless there are persistent or
intervening clinical concerns. (Code:M9-4-QRF)

I have discussed the findings and recommendations with the patient.
If applicable, a reminder letter will be sent to the patient
regarding the next appointment.

BI-RADS CATEGORY  2: Benign.

## 2021-10-24 ENCOUNTER — Encounter: Payer: Self-pay | Admitting: Cardiovascular Disease

## 8387-08-09 DEATH — deceased
